# Patient Record
Sex: Female | Born: 1980 | Race: White | Hispanic: No | Marital: Married | Smoking: Former smoker
Health system: Southern US, Community
[De-identification: ages and names within clinical notes are randomized; demographics above are authoritative.]

## PROBLEM LIST (undated history)

## (undated) HISTORY — PX: CHOLECYSTECTOMY: SHX55

---

## 2017-09-27 ENCOUNTER — Emergency Department (HOSPITAL_COMMUNITY)
Admission: EM | Admit: 2017-09-27 | Discharge: 2017-09-27 | Disposition: A | Discharge status: Home / Self Care | Payer: No Typology Code available for payment source | Attending: Emergency Medicine | Admitting: Emergency Medicine

## 2017-09-27 ENCOUNTER — Encounter (HOSPITAL_COMMUNITY): Payer: Self-pay

## 2017-09-27 ENCOUNTER — Other Ambulatory Visit: Payer: Self-pay

## 2017-09-27 ENCOUNTER — Emergency Department (HOSPITAL_COMMUNITY): Payer: No Typology Code available for payment source

## 2017-09-27 DIAGNOSIS — R109 Unspecified abdominal pain: Secondary | ICD-10-CM | POA: Diagnosis present

## 2017-09-27 DIAGNOSIS — Z87891 Personal history of nicotine dependence: Secondary | ICD-10-CM | POA: Diagnosis not present

## 2017-09-27 DIAGNOSIS — N201 Calculus of ureter: Secondary | ICD-10-CM | POA: Diagnosis not present

## 2017-09-27 DIAGNOSIS — E876 Hypokalemia: Secondary | ICD-10-CM | POA: Diagnosis not present

## 2017-09-27 LAB — URINALYSIS, MICROSCOPIC (REFLEX): RBC / HPF: >50 RBC/hpf (ref 0–5)

## 2017-09-27 LAB — CBC WITH DIFFERENTIAL/PLATELET
BASOS PCT: 0 %
Basophils Absolute: 0 10*3/uL (ref 0.0–0.1)
Eosinophils Absolute: 0.1 10*3/uL (ref 0.0–0.7)
Eosinophils Relative: 1 %
HEMATOCRIT: 38 % (ref 36.0–46.0)
HEMOGLOBIN: 12.5 g/dL (ref 12.0–15.0)
LYMPHS ABS: 2.8 10*3/uL (ref 0.7–4.0)
LYMPHS PCT: 34 %
MCH: 26.7 pg (ref 26.0–34.0)
MCHC: 32.9 g/dL (ref 30.0–36.0)
MCV: 81 fL (ref 78.0–100.0)
MONO ABS: 0.8 10*3/uL (ref 0.1–1.0)
Monocytes Relative: 10 %
NEUTROS ABS: 4.4 10*3/uL (ref 1.7–7.7)
Neutrophils Relative %: 55 %
Platelets: 318 10*3/uL (ref 150–400)
RBC: 4.69 MIL/uL (ref 3.87–5.11)
RDW: 13.7 % (ref 11.5–15.5)
WBC: 8.1 10*3/uL (ref 4.0–10.5)

## 2017-09-27 LAB — COMPREHENSIVE METABOLIC PANEL
ALBUMIN: 3.8 g/dL (ref 3.5–5.0)
ALT: 21 U/L (ref 0–44)
ANION GAP: 10 (ref 5–15)
AST: 27 U/L (ref 15–41)
Alkaline Phosphatase: 56 U/L (ref 38–126)
BUN: 8 mg/dL (ref 6–20)
CHLORIDE: 105 mmol/L (ref 98–111)
CO2: 26 mmol/L (ref 22–32)
Calcium: 8.8 mg/dL — ABNORMAL LOW (ref 8.9–10.3)
Creatinine, Ser: 0.55 mg/dL (ref 0.44–1.00)
GFR calc Af Amer: >60 mL/min (ref >60)
GFR calc non Af Amer: >60 mL/min (ref >60)
GLUCOSE: 89 mg/dL (ref 70–99)
POTASSIUM: 3.3 mmol/L — AB (ref 3.5–5.1)
Sodium: 141 mmol/L (ref 135–145)
Total Bilirubin: 1.1 mg/dL (ref 0.3–1.2)
Total Protein: 7 g/dL (ref 6.5–8.1)

## 2017-09-27 LAB — I-STAT BETA HCG BLOOD, ED (MC, WL, AP ONLY): I-stat hCG, quantitative: <5 m[IU]/mL (ref <5)

## 2017-09-27 LAB — URINALYSIS, ROUTINE W REFLEX MICROSCOPIC

## 2017-09-27 MED ORDER — KETOROLAC TROMETHAMINE 30 MG/ML IJ SOLN
15.0000 mg | Freq: Once | INTRAMUSCULAR | Status: AC
  Administered 2017-09-27: 15 mg via INTRAVENOUS
  Filled 2017-09-27: qty 1

## 2017-09-27 MED ORDER — OXYCODONE-ACETAMINOPHEN 5-325 MG PO TABS
1.0000 | ORAL_TABLET | Freq: Once | ORAL | Status: AC
  Administered 2017-09-27: 1 via ORAL
  Filled 2017-09-27: qty 1

## 2017-09-27 MED ORDER — OXYCODONE-ACETAMINOPHEN 5-325 MG PO TABS: 1.0000 | ORAL_TABLET | ORAL | 0 refills | Status: AC | PRN

## 2017-09-27 MED ORDER — ONDANSETRON 4 MG PO TBDP: 4.0000 mg | ORAL_TABLET | Freq: Three times a day (TID) | ORAL | 0 refills | Status: AC | PRN

## 2017-09-27 MED ORDER — MORPHINE SULFATE (PF) 4 MG/ML IV SOLN
4.0000 mg | Freq: Once | INTRAVENOUS | Status: AC
  Administered 2017-09-27: 4 mg via INTRAVENOUS
  Filled 2017-09-27: qty 1

## 2017-09-27 MED ORDER — ONDANSETRON HCL 4 MG/2ML IJ SOLN
4.0000 mg | Freq: Once | INTRAMUSCULAR | Status: AC
  Administered 2017-09-27: 4 mg via INTRAVENOUS
  Filled 2017-09-27: qty 2

## 2017-09-27 NOTE — ED Triage Notes (Addendum)
Pt concerned for kidney stone or infection. C/o right flank pain. Diaphoresis, nausea, vomiting. Minimal relief with naproxen.

## 2017-09-27 NOTE — ED Notes (Signed)
ED Provider at bedside. 

## 2017-09-27 NOTE — ED Provider Notes (Signed)
[facility] DEPT Provider Note   CSN: [id number] Arrival date & time: 09/27/17  1433   History   Chief Complaint Chief Complaint  Patient presents with  . Flank Pain    HPI Brandy Fritz is a 37 y.o. female who presents with right flank pain.  Past medical history significant for kidney stones.  She states that the pain started about 1 day ago.  She states that it starts over the right flank and radiates to the right upper quadrant.  The pain is constant and feels similar to when she had a kidney stone a year ago.  It was a 1.7 cm stone which had to she had to undergo lithotripsy for.  She is from [place] and was dropping her kids off with their grandparents who met her in [place].  She states that she and her husband are on their way to [facility] this evening to meet friends.  She reports the pain is an 8 out of 10.  She reports associated nausea and feeling hot and cold but is unsure of true fevers.  She is had a couple of episodes of nausea and vomiting.  She has had hematuria but denies dysuria or difficulty urinating.  HPI  History reviewed. No pertinent past medical history.  There are no active problems to display for this patient.   Past Surgical History:  Procedure Laterality Date  . CHOLECYSTECTOMY       OB History   None      Home Medications    Prior to Admission medications   Not on File    Family History No family history on file.  Social History Social History   Tobacco Use  . Smoking status: Former Smoker    Last attempt to quit: 09/27/2001    Years since quitting: 16.0  . Smokeless tobacco: Never Used  Substance Use Topics  . Alcohol use: Yes    Alcohol/week: 0.6 oz    Types: 1 Glasses of wine per week  . Drug use: Never     Allergies   Patient has no known allergies.   Review of Systems Review of Systems  Constitutional: Positive for chills. Negative for fever.  Respiratory: Negative for  shortness of breath.   Cardiovascular: Negative for chest pain.  Gastrointestinal: Positive for abdominal pain, nausea and vomiting.  Genitourinary: Positive for flank pain and hematuria. Negative for pelvic pain, vaginal bleeding and vaginal discharge.  All other systems reviewed and are negative.    Physical Exam Updated Vital Signs BP (!) 124/91   Pulse 71   Temp 97.8 F (36.6 C) (Oral)   Resp 14   Ht _0  (1.549 m)   Wt 68 kg (150 lb)   SpO2 100%   BMI 28.34 kg/m   Physical Exam  Constitutional: She is oriented to person, place, and time. She appears well-developed and well-nourished. No distress.  Calm, cooperative.   HENT:  Head: Normocephalic and atraumatic.  Eyes: Pupils are equal, round, and reactive to light. Conjunctivae are normal. Right eye exhibits no discharge. Left eye exhibits no discharge. No scleral icterus.  Neck: Normal range of motion.  Cardiovascular: Normal rate and regular rhythm.  Pulmonary/Chest: Effort normal and breath sounds normal. No respiratory distress.  Abdominal: Soft. Bowel sounds are normal. She exhibits no distension and no mass. There is tenderness (RUQ tenderness). There is no rebound and no guarding.  R CVA tenderness  Neurological: She is alert and oriented to person, place, and  time.  Skin: Skin is warm and dry.  Psychiatric: She has a normal mood and affect. Her behavior is normal.  Nursing note and vitals reviewed.    ED Treatments / Results  Labs (all labs ordered are listed, but only abnormal results are displayed) Labs Reviewed  URINALYSIS, ROUTINE W REFLEX MICROSCOPIC - Abnormal; Notable for the following components:      Result Value   Color, Urine BROWN (*)    APPearance TURBID (*)    Glucose, UA   (*)    Value: TEST NOT REPORTED DUE TO COLOR INTERFERENCE OF URINE PIGMENT   Hgb urine dipstick   (*)    Value: TEST NOT REPORTED DUE TO COLOR INTERFERENCE OF URINE PIGMENT   Bilirubin Urine   (*)    Value: TEST NOT  REPORTED DUE TO COLOR INTERFERENCE OF URINE PIGMENT   Ketones, ur   (*)    Value: TEST NOT REPORTED DUE TO COLOR INTERFERENCE OF URINE PIGMENT   Protein, ur   (*)    Value: TEST NOT REPORTED DUE TO COLOR INTERFERENCE OF URINE PIGMENT   Nitrite   (*)    Value: TEST NOT REPORTED DUE TO COLOR INTERFERENCE OF URINE PIGMENT   Leukocytes, UA   (*)    Value: TEST NOT REPORTED DUE TO COLOR INTERFERENCE OF URINE PIGMENT   All other components within normal limits  URINALYSIS, MICROSCOPIC (REFLEX) - Abnormal; Notable for the following components:   Bacteria, UA FEW (*)    All other components within normal limits  COMPREHENSIVE METABOLIC PANEL - Abnormal; Notable for the following components:   Potassium 3.3 (*)    Calcium 8.8 (*)    All other components within normal limits  CBC WITH DIFFERENTIAL/PLATELET  I-STAT BETA HCG BLOOD, ED ([facility], [facility], [facility] ONLY)    EKG None  Radiology Ct Renal Stone Study  Result Date: 09/27/2017 CLINICAL DATA:  Right flank pain and history of stones. EXAM: CT ABDOMEN AND PELVIS WITHOUT CONTRAST TECHNIQUE: Multidetector CT imaging of the abdomen and pelvis was performed following the standard protocol without IV contrast. COMPARISON:  None. FINDINGS: Lower chest: No acute abnormality. Hepatobiliary: No focal liver abnormality is seen. Status post cholecystectomy. No biliary dilatation. Pancreas: Unremarkable. No pancreatic ductal dilatation or surrounding inflammatory changes. Spleen: Normal in size without focal abnormality. Adrenals/Urinary Tract: There is mild to moderate right-sided hydronephrosis secondary to a 7 x 4 x 7 mm calculus in the distal right renal pelvis near the UPJ. Nonobstructing lower pole renal calculus measuring 9 x 5 x 6 mm is also noted on the right. Left-sided lower pole renal calculus measuring 3 mm is seen. No obstructive uropathy is noted on the left. No ureteral calculi. Urinary bladder is unremarkable for the degree of distention and decompressed  in appearance. Stomach/Bowel: Stomach is within normal limits. Appendix appears normal. No evidence of bowel wall thickening, distention, or inflammatory changes. Vascular/Lymphatic: No significant vascular findings are present. No enlarged abdominal or pelvic lymph nodes. Reproductive: Uterus and bilateral adnexa are unremarkable. Other: No abdominal wall hernia or abnormality. No abdominopelvic ascites. 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 No acute osseous abnormality. IMPRESSION: 1. There is a 7 x 4 x 7 mm right-sided distal renal pelvic stone near the UPJ causing mild moderate hydronephrosis. 2. Additional nonobstructing bilateral lower pole renal calculi are noted, the largest measuring 9 x 5 x 6 mm on the right and 3 mm on the left. 3. 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   On: 09/27/2017 18:19    Procedures Procedures (including critical care time)  Medications Ordered in ED Medications  morphine 4 MG/ML injection 4 mg (4 mg Intravenous Given 09/27/17 1822)  ketorolac (TORADOL) 30 MG/ML injection 15 mg (15 mg Intravenous Given 09/27/17 1822)  ondansetron (ZOFRAN) injection 4 mg (4 mg Intravenous Given 09/27/17 1822)  morphine 4 MG/ML injection 4 mg (4 mg Intravenous Given 09/27/17 1921)  oxyCODONE-acetaminophen (PERCOCET/ROXICET) 5-325 MG per tablet 1 tablet (1 tablet Oral Given 09/27/17 1921)     Initial Impression / Assessment and Plan / ED Course  I have reviewed the triage vital signs and the nursing notes.  Pertinent labs & imaging results that were available during my care of the patient were reviewed by me and considered in my medical decision making (see chart for details).  37 year old female with acute onset of right flank pain.  She states it feels similar to prior kidney stones.  She is mildly hypertensive  otherwise vital signs are normal.  On exam she has right flank tenderness and right upper quadrant tenderness.  CBC is normal.  CMP is remarkable for mild hypokalemia.  Her urinalysis was then able to be adequately tested because of hematuria but only shows few bacteria and 0-5 white blood cells. CT renal and pain control were ordered.  7:19 PM CT shows 7 x 4 x 7 mm stone near the UPJ with mild to moderate hydronephrosis.  Discussed results with patient.  Pain is improved but is still 7 out of 10.  We will give her another dose of pain medicine and reassess.  Pain is much better and is more controlled.  She is comfortable with discharge.  We will give her prescription for pain medicine, nausea medicine she was advised to continue naproxen and heating pads for pain.  She will follow-up with her doctor on Monday.   Final Clinical Impressions(s) / ED Diagnoses   Final diagnoses:  Right ureteral stone  Hypokalemia    ED Discharge Orders    None       Iris Pert 09/27/17 2112    Noemi Chapel, MD 09/30/17 1130

## 2017-09-27 NOTE — ED Notes (Signed)
Pt to CT

## 2017-09-27 NOTE — Discharge Instructions (Signed)
Take narcotic pain medicine as needed. Do not drink alcohol or drive while taking this medicine Naproxen - Take for pain and inflammation. Take with food three times daily Take Zofran for nausea Return if symptoms are worsening

## 2020-02-10 IMAGING — CT CT RENAL STONE PROTOCOL
2 of 5 series · 16 of 46 positions shown, 18 images · non-contrast
Comparison: None.

CLINICAL DATA: Right flank pain and history of stones.

EXAM:
CT ABDOMEN AND PELVIS WITHOUT CONTRAST
TECHNIQUE: Multidetector CT imaging of the abdomen and pelvis was performed
following the standard protocol without IV contrast.

[Series 5: thins · axial · 0.64mm/px · z∈[+1292,+1686]mm · 13 of 432 slices shown, 15 images]
[im 19/432  soft-tissue]
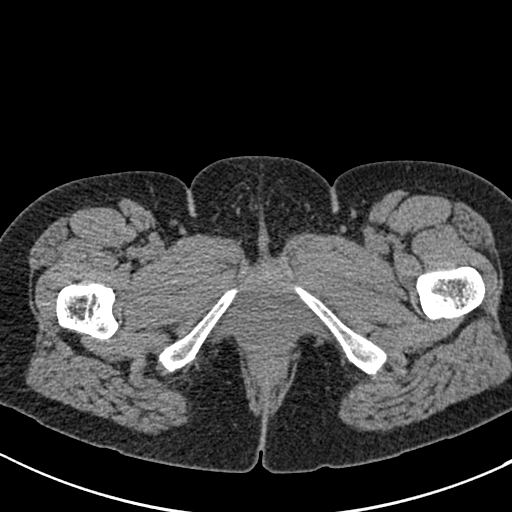
[im 19/432  bone]
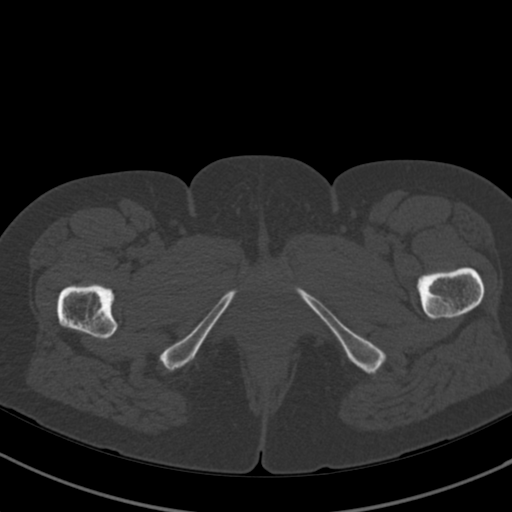
[im 57/432  soft-tissue]
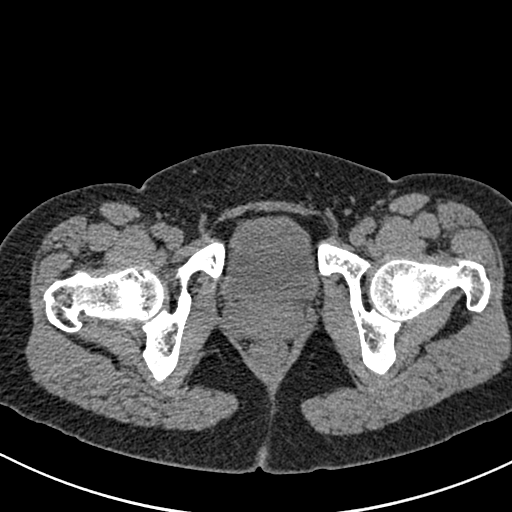
[im 94/432  soft-tissue]
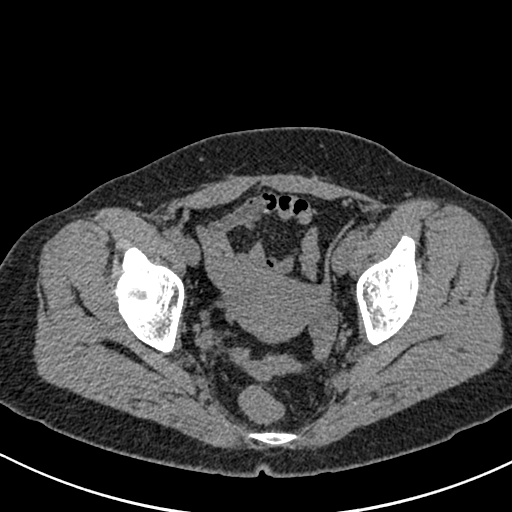
[im 113/432  soft-tissue]
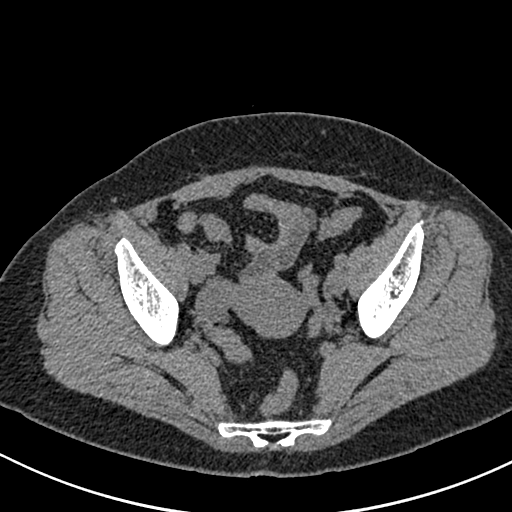
[im 150/432  soft-tissue]
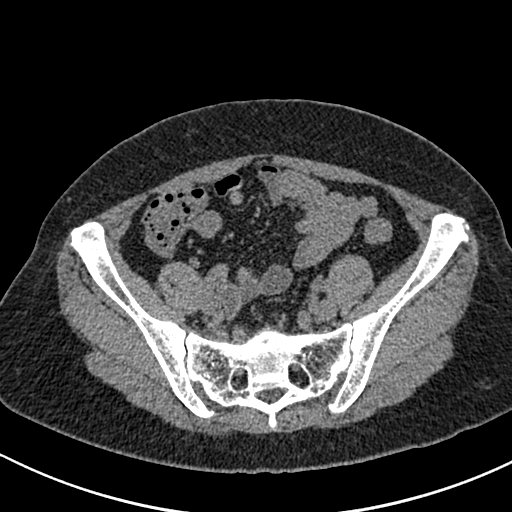
[im 188/432  soft-tissue]
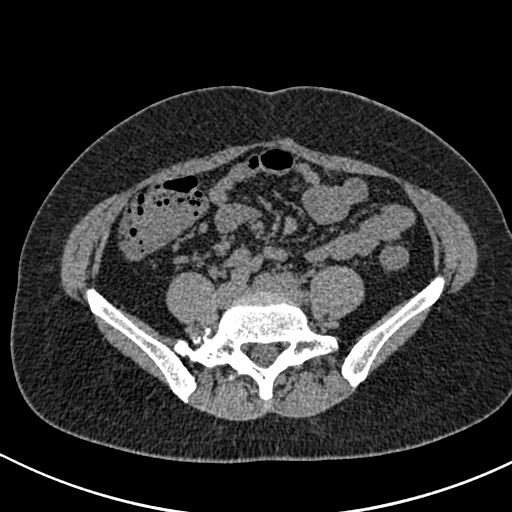
[im 225/432  soft-tissue]
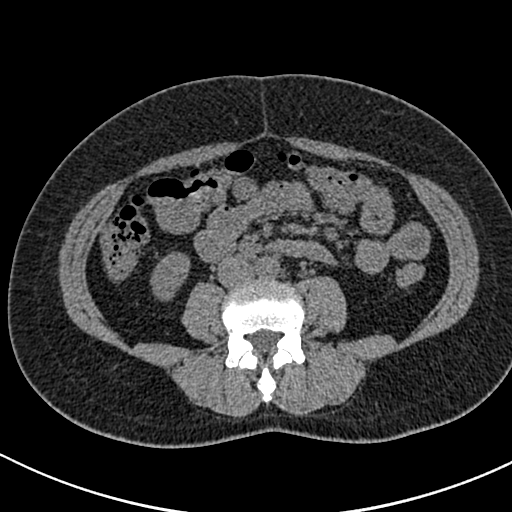
[im 244/432  soft-tissue]
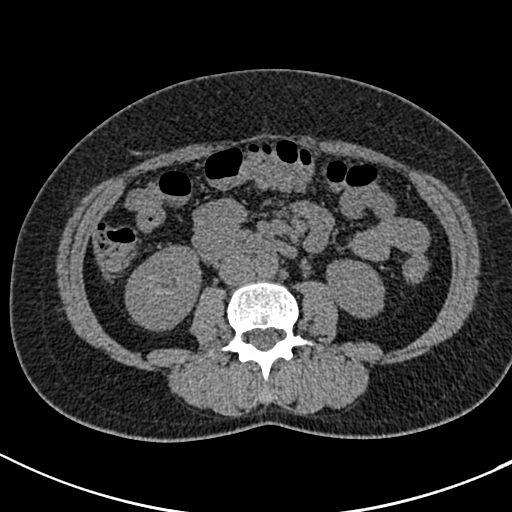
[im 282/432  soft-tissue]
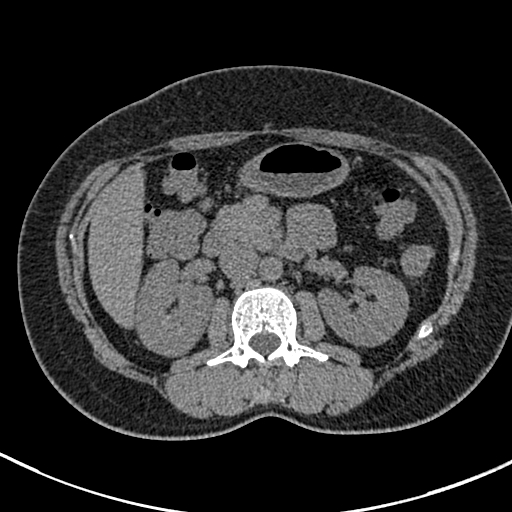
[im 282/432  bone]
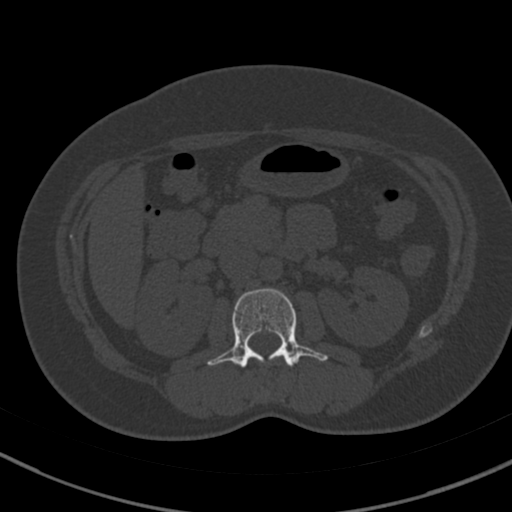
[im 319/432  soft-tissue]
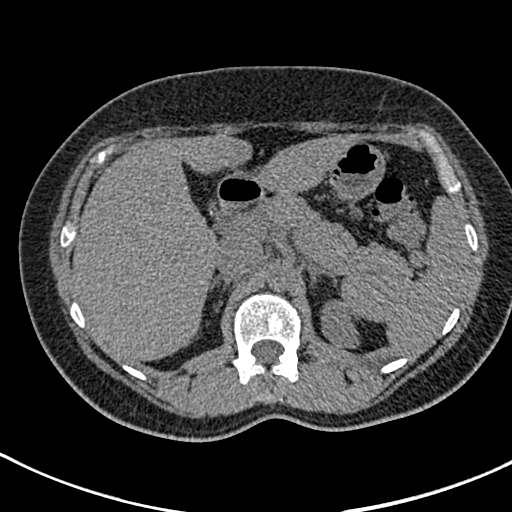
[im 338/432  soft-tissue]
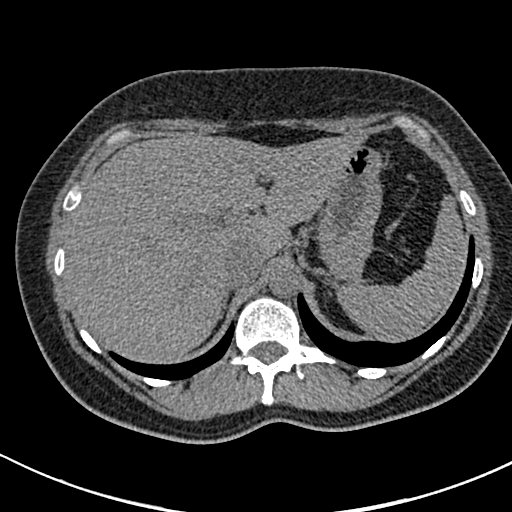
[im 375/432  soft-tissue]
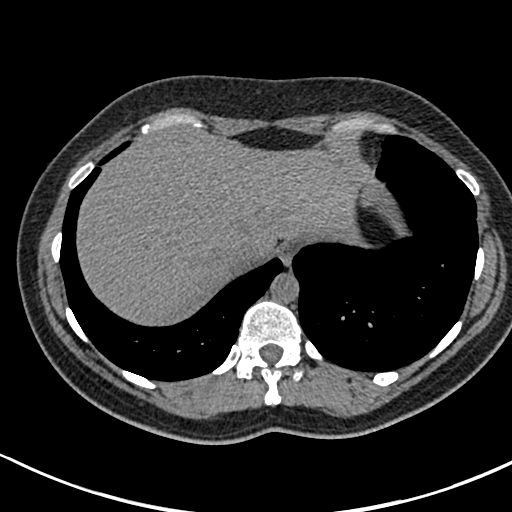
[im 413/432  soft-tissue]
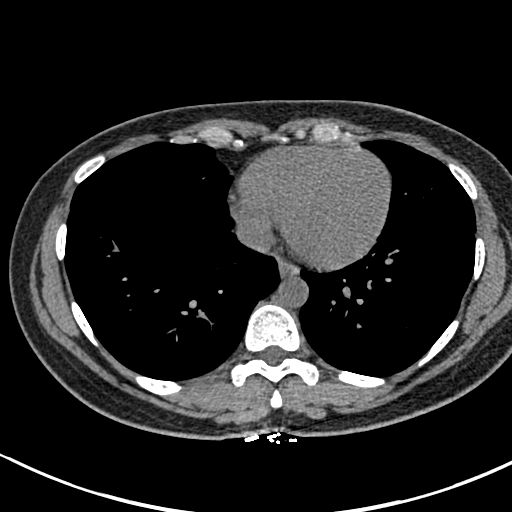

[Series 6: coronal · coronal · 0.82mm/px · 3 of 150 slices shown]
[im 50/150  soft-tissue]
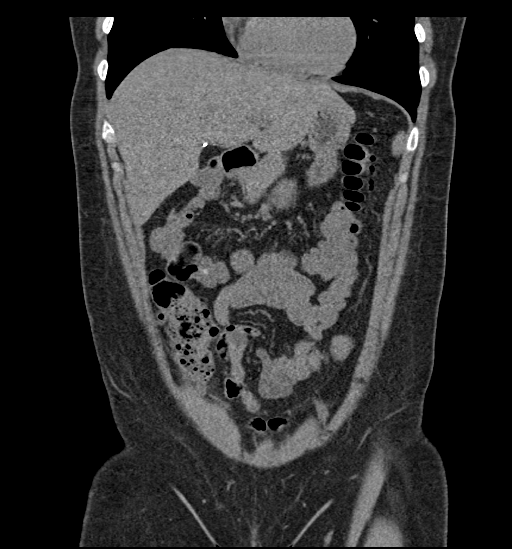
[im 67/150  soft-tissue]
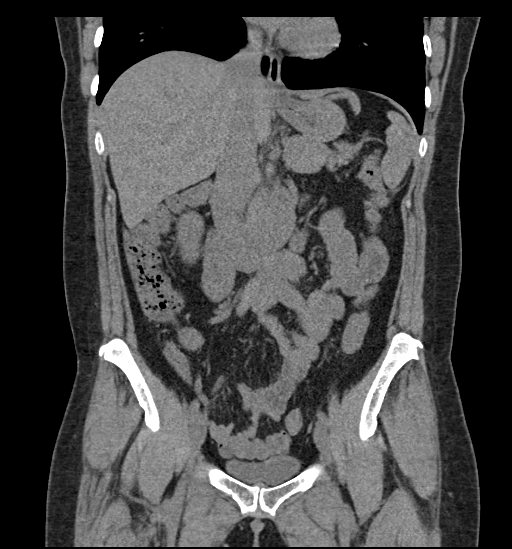
[im 83/150  soft-tissue]
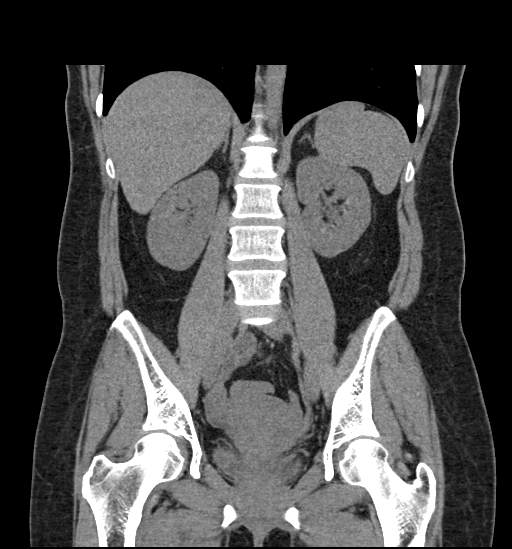

[16 of 46 positions shown; findings below may reference images not displayed]

FINDINGS: Lower chest: No acute abnormality.

Hepatobiliary: No focal liver abnormality is seen. Status post
cholecystectomy. No biliary dilatation.

Pancreas: Unremarkable. No pancreatic ductal dilatation or
surrounding inflammatory changes.

Spleen: Normal in size without focal abnormality.

Adrenals/Urinary Tract: There is mild to moderate right-sided
hydronephrosis secondary to a 7 x 4 x 7 mm calculus in the distal
right renal pelvis near the UPJ. Nonobstructing lower pole renal
calculus measuring 9 x 5 x 6 mm is also noted on the right.
Left-sided lower pole renal calculus measuring 3 mm is seen. No
obstructive uropathy is noted on the left. No ureteral calculi.
Urinary bladder is unremarkable for the degree of distention and
decompressed in appearance.

Stomach/Bowel: Stomach is within normal limits. Appendix appears
normal. No evidence of bowel wall thickening, distention, or
inflammatory changes.

Vascular/Lymphatic: No significant vascular findings are present. No
enlarged abdominal or pelvic lymph nodes.

Reproductive: Uterus and bilateral adnexa are unremarkable.

Other: No abdominal wall hernia or abnormality. No abdominopelvic
ascites.

Musculoskeletal: Right L5-S1 assimilation joint consistent with
lumbosacral transitional vertebral anatomy. No acute osseous
abnormality.
IMPRESSION: 1. There is a 7 x 4 x 7 mm right-sided distal renal pelvic stone
near the UPJ causing mild moderate hydronephrosis.
2. Additional nonobstructing bilateral lower pole renal calculi are
noted, the largest measuring 9 x 5 x 6 mm on the right and 3 mm on
the left.
3. L5-S1 assimilation joint on the right consistent with lumbosacral
transitional vertebral anatomy
